# Patient Record
Sex: Male | Born: 1970 | Race: White | Hispanic: No | Marital: Married | Smoking: Never smoker
Health system: Southern US, Community
[De-identification: ages and names within clinical notes are randomized; demographics above are authoritative.]

## PROBLEM LIST (undated history)

## (undated) DIAGNOSIS — E785 Hyperlipidemia, unspecified: Secondary | ICD-10-CM

## (undated) HISTORY — PX: WRIST SURGERY: SHX841

---

## 2004-06-20 ENCOUNTER — Emergency Department (HOSPITAL_COMMUNITY): Admission: EM | Admit: 2004-06-20 | Discharge: 2004-06-21 | Payer: Self-pay | Admitting: *Deleted

## 2005-05-31 ENCOUNTER — Ambulatory Visit (HOSPITAL_COMMUNITY): Admission: RE | Admit: 2005-05-31 | Discharge: 2005-05-31 | Payer: Self-pay | Admitting: Orthopedic Surgery

## 2007-04-20 IMAGING — CR DG SHOULDER 2+V*L*
3 series · 3 of 3 positions shown · non-contrast
Comparison: none

CLINICAL DATA: Shoulder injury playing baseball

LEFT SHOULDER - 3 VIEW

[w shoulder ap internal left *]
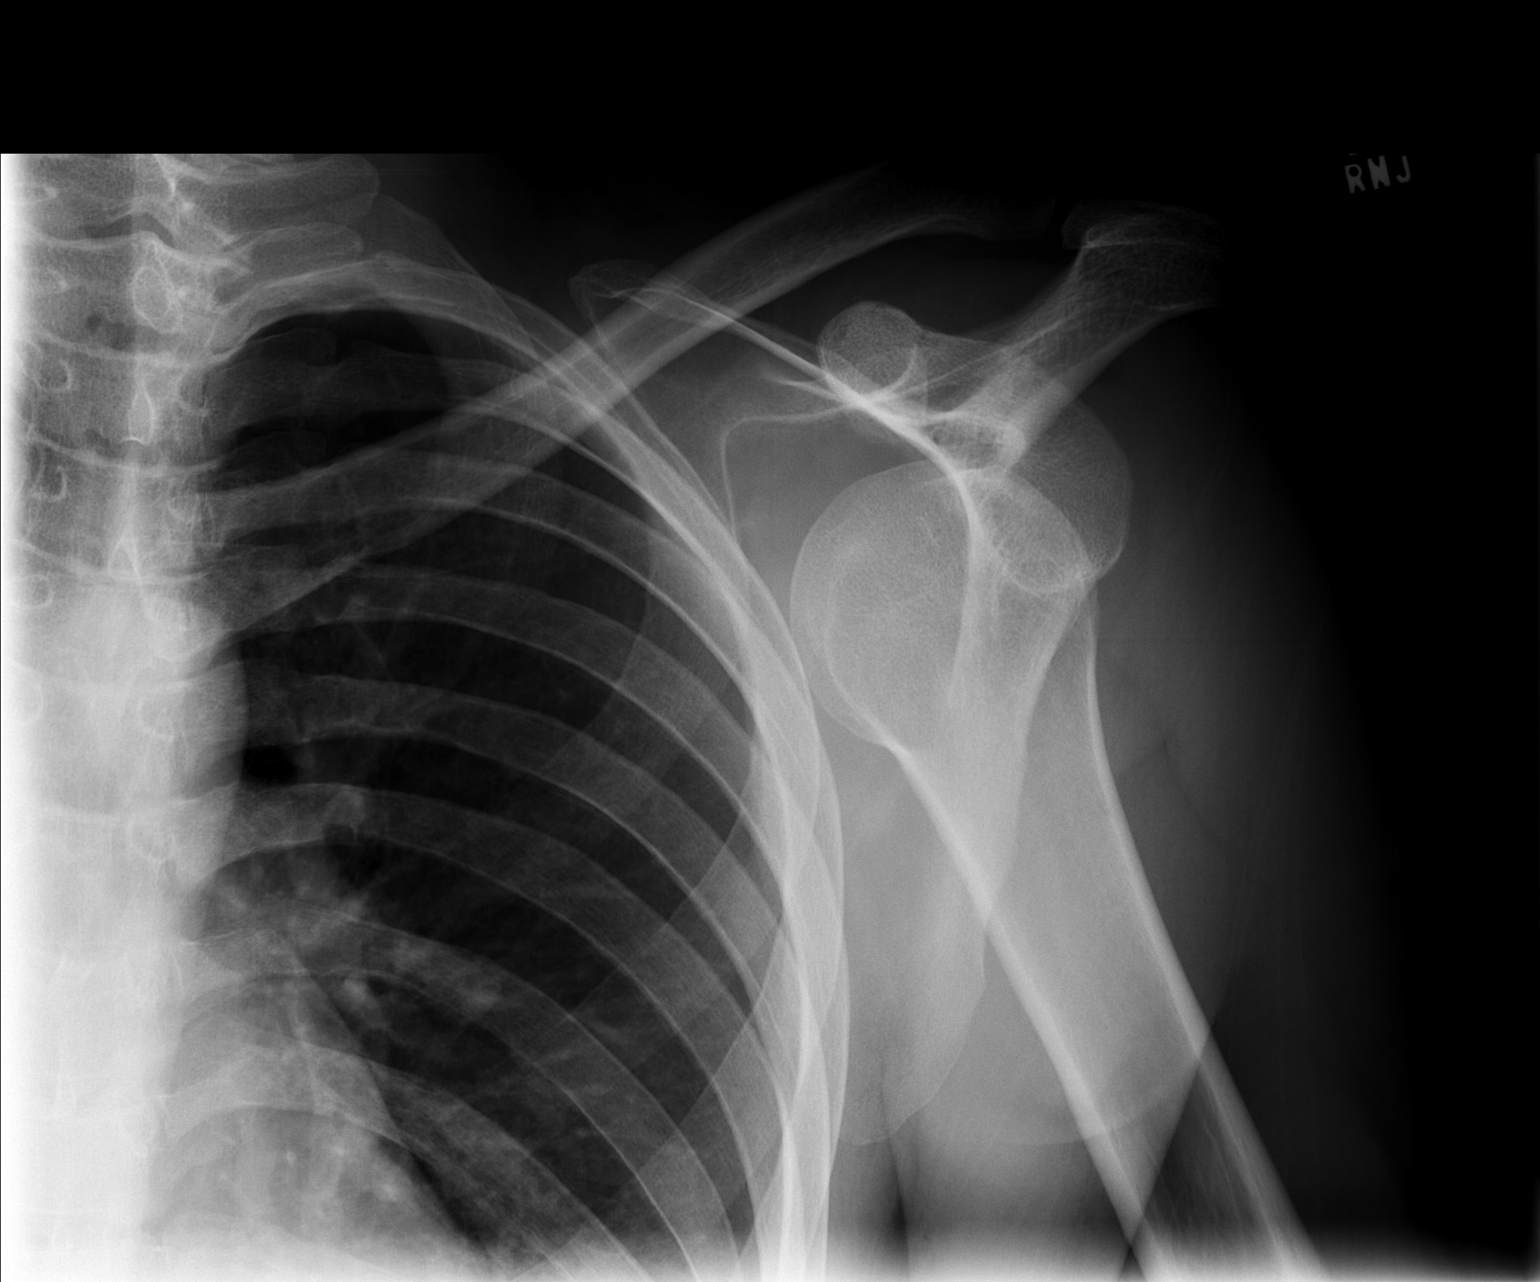

[w shoulder y view left (1 of 2)]
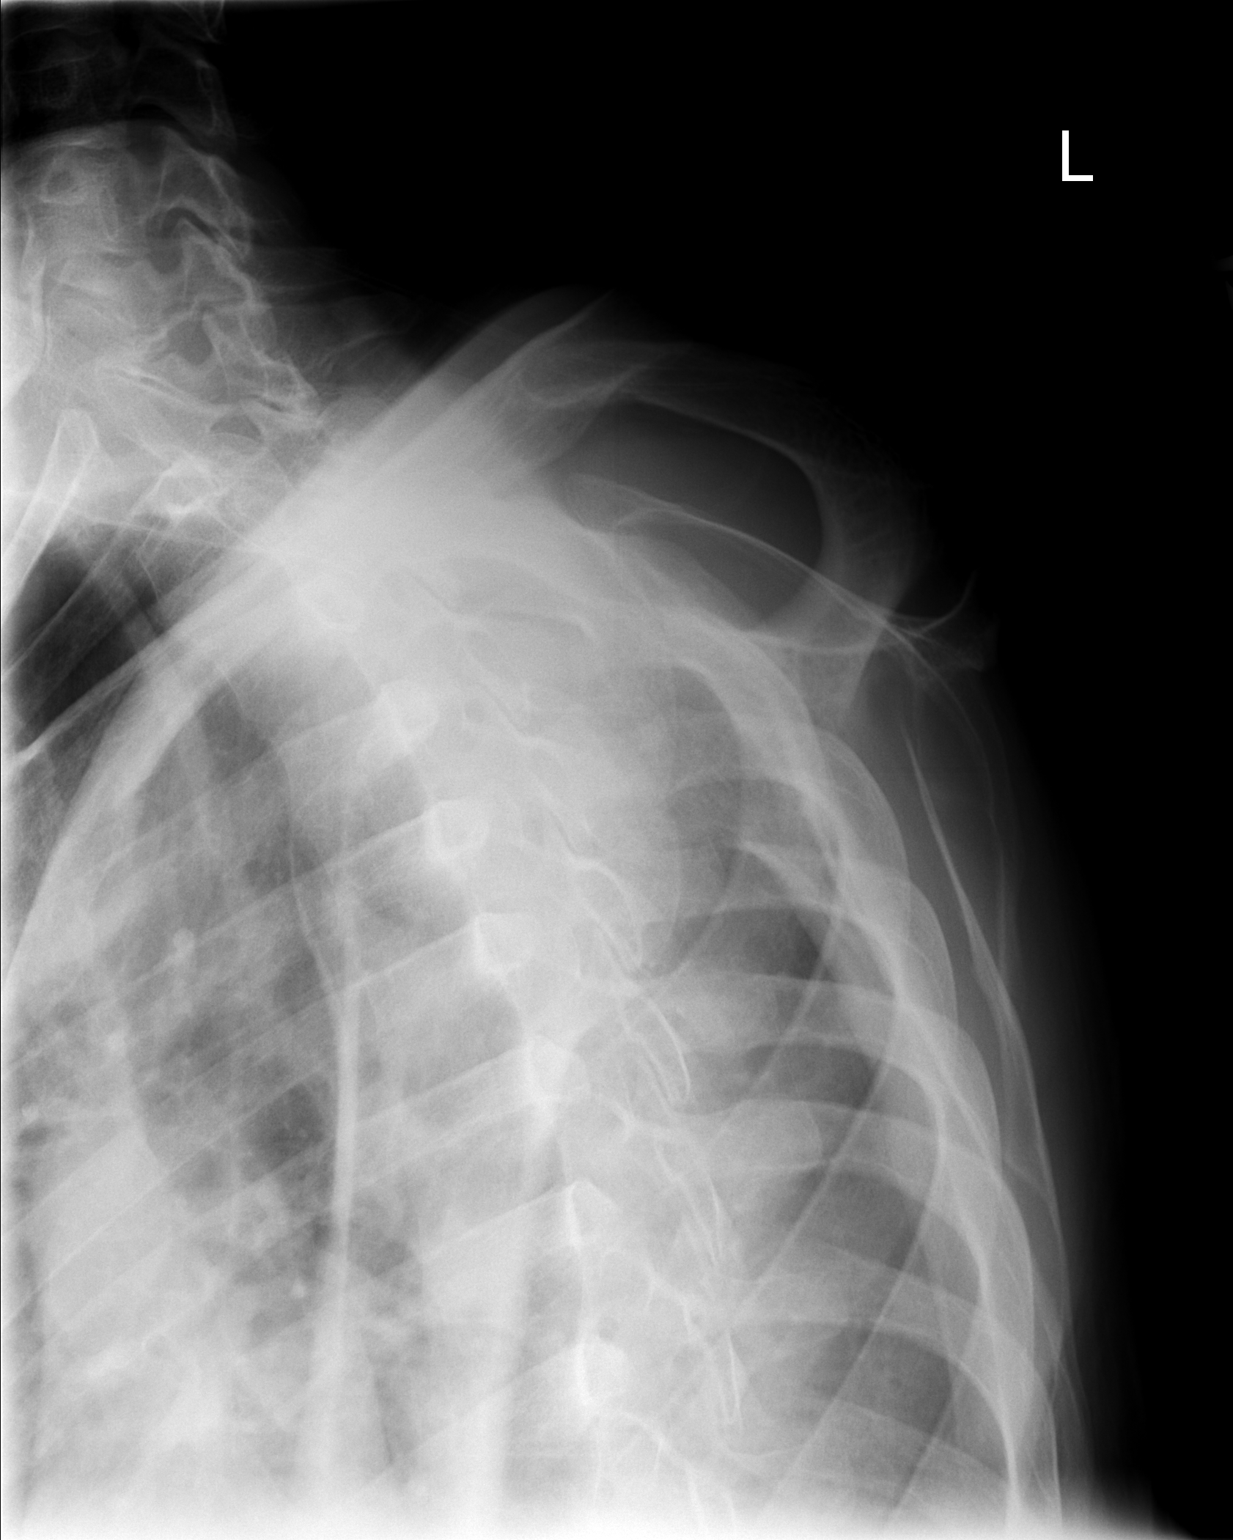

[w shoulder y view left (2 of 2)]
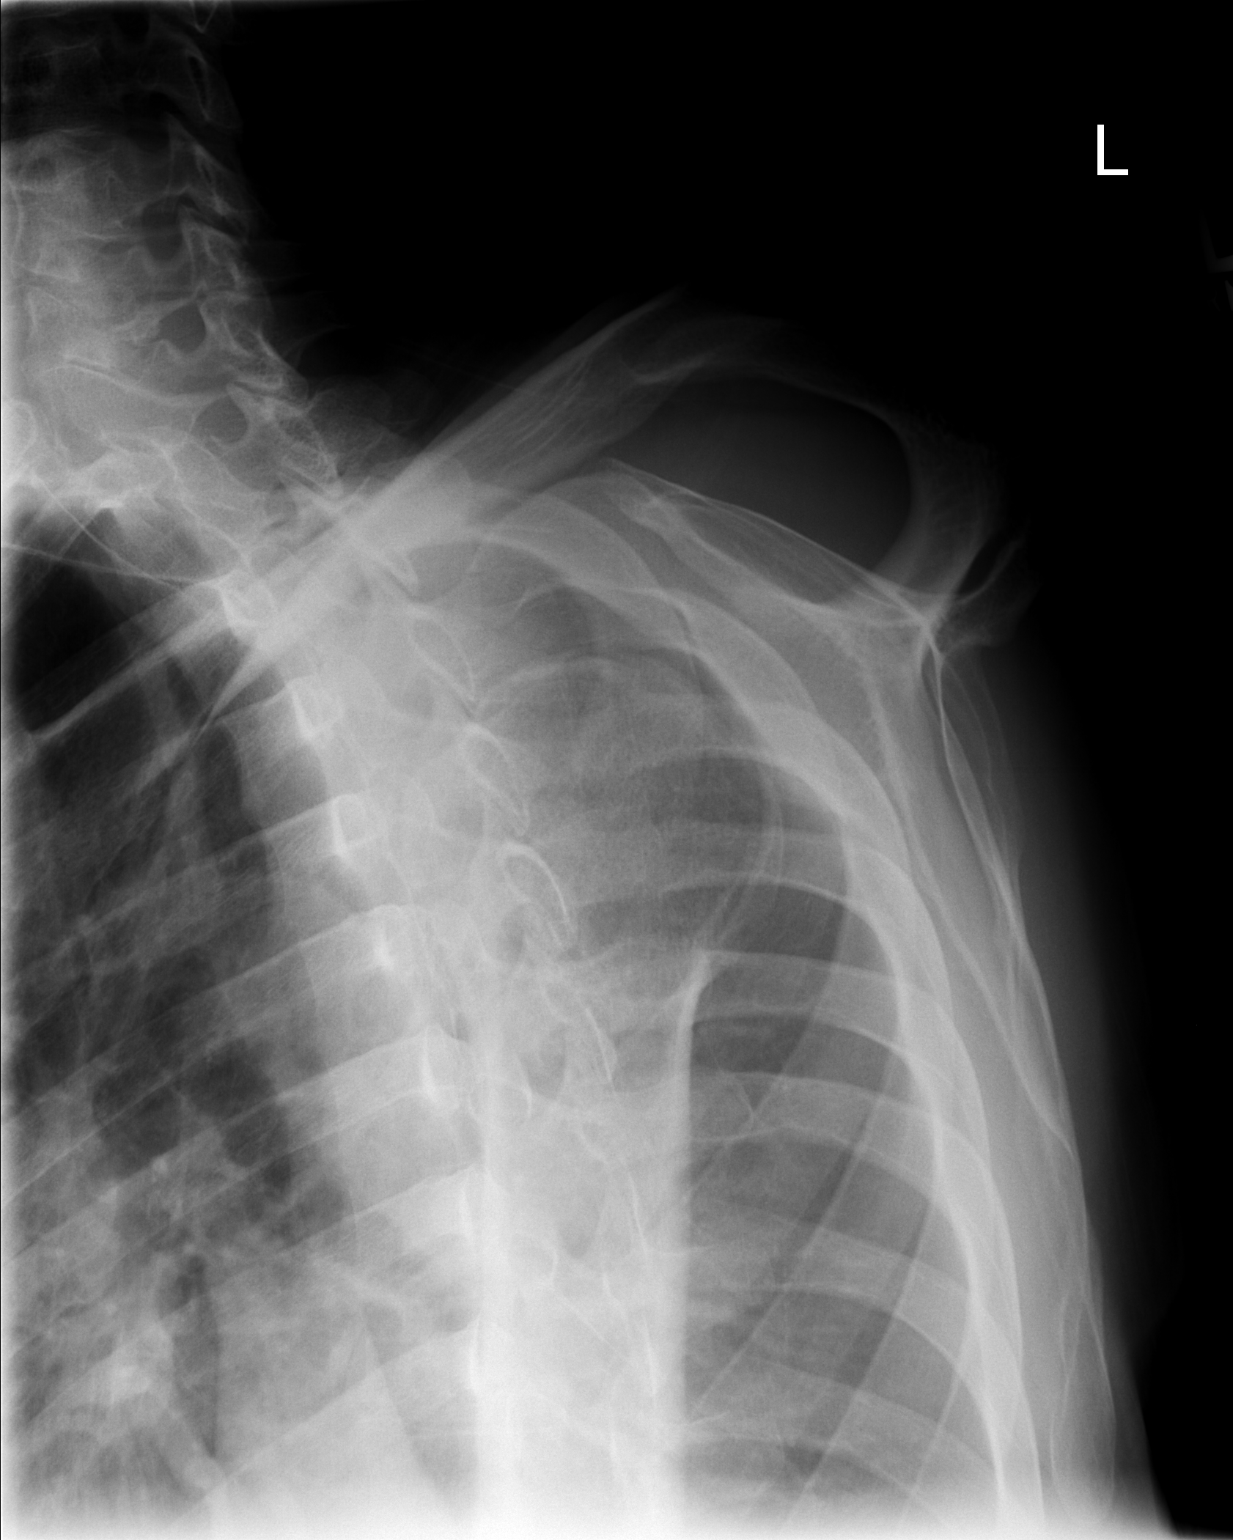

[3 of 3 positions shown; findings below may reference images not displayed]

FINDINGS: There is anterior dislocation of the left humeral head. No definite
fracture seen. Recommend postreduction views.

IMPRESSION

Anterior left shoulder dislocation.

## 2014-07-06 ENCOUNTER — Encounter (HOSPITAL_BASED_OUTPATIENT_CLINIC_OR_DEPARTMENT_OTHER): Payer: Self-pay | Admitting: *Deleted

## 2014-07-06 ENCOUNTER — Emergency Department (HOSPITAL_BASED_OUTPATIENT_CLINIC_OR_DEPARTMENT_OTHER)
Admission: EM | Admit: 2014-07-06 | Discharge: 2014-07-06 | Disposition: A | Discharge status: Home / Self Care | Payer: Managed Care, Other (non HMO) | Attending: Emergency Medicine | Admitting: Emergency Medicine

## 2014-07-06 DIAGNOSIS — S0101XA Laceration without foreign body of scalp, initial encounter: Secondary | ICD-10-CM | POA: Diagnosis not present

## 2014-07-06 DIAGNOSIS — Y288XXA Contact with other sharp object, undetermined intent, initial encounter: Secondary | ICD-10-CM | POA: Diagnosis not present

## 2014-07-06 DIAGNOSIS — Y9289 Other specified places as the place of occurrence of the external cause: Secondary | ICD-10-CM | POA: Insufficient documentation

## 2014-07-06 DIAGNOSIS — Z23 Encounter for immunization: Secondary | ICD-10-CM | POA: Diagnosis not present

## 2014-07-06 DIAGNOSIS — Y998 Other external cause status: Secondary | ICD-10-CM | POA: Diagnosis not present

## 2014-07-06 DIAGNOSIS — Y9389 Activity, other specified: Secondary | ICD-10-CM | POA: Diagnosis not present

## 2014-07-06 DIAGNOSIS — S0191XA Laceration without foreign body of unspecified part of head, initial encounter: Secondary | ICD-10-CM

## 2014-07-06 MED ORDER — LIDOCAINE-EPINEPHRINE (PF) 2 %-1:200000 IJ SOLN
10.0000 mL | Freq: Once | INTRAMUSCULAR | Status: AC
  Administered 2014-07-06: 10 mL via INTRADERMAL
  Filled 2014-07-06: qty 10

## 2014-07-06 MED ORDER — TETANUS-DIPHTH-ACELL PERTUSSIS 5-2.5-18.5 LF-MCG/0.5 IM SUSP
0.5000 mL | Freq: Once | INTRAMUSCULAR | Status: AC
  Administered 2014-07-06: 0.5 mL via INTRAMUSCULAR
  Filled 2014-07-06: qty 0.5

## 2014-07-06 MED ORDER — BACITRACIN 500 UNIT/GM EX OINT
1.0000 "application " | TOPICAL_OINTMENT | Freq: Two times a day (BID) | CUTANEOUS | Status: DC
  Filled 2014-07-06: qty 0.9

## 2014-07-06 NOTE — ED Notes (Signed)
Laceration to the right side of his head. He hit it on the frame of his truck.

## 2014-07-06 NOTE — ED Notes (Signed)
Laceration to head, R upper forehead. Sutures look WNL  Site well approximated, no drainage noted

## 2014-07-06 NOTE — ED Notes (Signed)
Pt verbalizes understanding of d/c instructions, will be back in 1 week for suture removal

## 2014-07-06 NOTE — Discharge Instructions (Signed)
Have the sutures removed by her primary care physician or an urgent care in 5-7 days. Keep the wound clean and dry. Take ibuprofen or Tylenol for pain.

## 2014-07-06 NOTE — ED Provider Notes (Signed)
CSN: [id number]     Arrival date & time 07/06/14  1622 History   First MD Initiated Contact with Patient 07/06/14 1624     Chief Complaint  Patient presents with  . Head Laceration     (Consider location/radiation/quality/duration/timing/severity/associated sxs/prior Treatment) The history is provided by the patient. No language interpreter was used.  Anthony Novak is a 44 year old male who presents with right scalp laceration while getting into his vehicle just prior to arrival. He states he got into the car to quickly and hit his head on the vehicle causing a laceration. He states he was able to stop the bleeding by applying butterfly sutures to the area and covering it with a Band-Aid. He denies any loss of consciousness, blurry vision, diplopia, dizziness, headache, nausea, vomiting. He denies being on any anticoagulation medications. No treatment with medication prior to arrival. He states that his last tetanus shot was more than 5 years ago but not remember when.  History reviewed. No pertinent past medical history. Past Surgical History  Procedure Laterality Date  . Wrist surgery     No family history on file. History  Substance Use Topics  . Smoking status: Never Smoker   . Smokeless tobacco: Not on file  . Alcohol Use: No    Review of Systems  Musculoskeletal: Negative for neck pain and neck stiffness.  Skin: Positive for wound.  Neurological: Negative for dizziness, syncope, weakness and light-headedness.      Allergies  Review of patient's allergies indicates no known allergies.  Home Medications   Prior to Admission medications   Not on File   BP 150/78 mmHg  Pulse 86  Temp(Src) 98.6 F (37 C) (Oral)  Resp 16  SpO2 100% Physical Exam  Constitutional: He is oriented to person, place, and time. He appears well-developed and well-nourished.  HENT:  Head: Normocephalic.  Eyes: Conjunctivae are normal.  Neck: Normal range of motion. Neck supple.   Cardiovascular: Normal rate.   Pulmonary/Chest: Effort normal.  Abdominal: He exhibits no distension.  Musculoskeletal: Normal range of motion.  Neurological: He is alert and oriented to person, place, and time.  Skin: Skin is warm and dry.  2 cm linear laceration to the right parietal scalp area. No active bleeding.    ED Course  Procedures (including critical care time) Labs Review Labs Reviewed - No data to display LACERATION REPAIR Performed by: Catha GosselinPatel-Mills, Hoyte Ziebell Authorized by: Catha GosselinPatel-Mills, Jeanette Moffatt Consent: Verbal consent obtained. Risks and benefits: risks, benefits and alternatives were discussed Consent given by: patient Patient identity confirmed: provided demographic data Prepped and Draped in normal sterile fashion Wound explored Laceration Location: right parietal scalp Laceration Length: 2 cm No Foreign Bodies seen or palpated Anesthesia: local infiltration Local anesthetic: lidocaine 2% with epinephrine Anesthetic total: 2 ml Irrigation method: syringe Amount of cleaning: standard Skin closure: 5.0 prolene Number of sutures: 1 Technique: running suture Patient tolerance: Patient tolerated the procedure well with no immediate complications. The skin and connective tissues could be visualized.  No foreign bodies were visualized. There was no pericranium or bone visualized.   Imaging Review No results found.   EKG Interpretation None      MDM   Final diagnoses:  Laceration of head, initial encounter  Patient presents for right scalp laceration that occurred just prior to arrival. Medications  lidocaine-EPINEPHrine (XYLOCAINE W/EPI) 2 %-1:200000 (PF) injection 10 mL (10 mLs Intradermal Given 07/06/14 1648)  Tdap (BOOSTRIX) injection 0.5 mL (0.5 mLs Intramuscular Given 07/06/14 1644)  He can follow  up with his PCP or urgent care to have the sutures removed in 5-7 days.       Catha Gosselin, PA-C 07/06/14 2234  Gwyneth Sprout, MD 07/08/14 [phone]

## 2022-08-29 ENCOUNTER — Ambulatory Visit: Payer: Self-pay | Admitting: General Surgery

## 2022-10-03 ENCOUNTER — Encounter (HOSPITAL_BASED_OUTPATIENT_CLINIC_OR_DEPARTMENT_OTHER): Payer: Self-pay | Admitting: General Surgery

## 2022-10-10 MED ORDER — CHLORHEXIDINE GLUCONATE CLOTH 2 % EX PADS: 6.0000 | MEDICATED_PAD | Freq: Once | CUTANEOUS | Status: DC

## 2022-10-10 NOTE — Progress Notes (Signed)

## 2022-10-11 ENCOUNTER — Ambulatory Visit (HOSPITAL_BASED_OUTPATIENT_CLINIC_OR_DEPARTMENT_OTHER): Payer: 59 | Admitting: Anesthesiology

## 2022-10-11 ENCOUNTER — Other Ambulatory Visit: Payer: Self-pay

## 2022-10-11 ENCOUNTER — Encounter (HOSPITAL_BASED_OUTPATIENT_CLINIC_OR_DEPARTMENT_OTHER): Payer: Self-pay | Admitting: General Surgery

## 2022-10-11 ENCOUNTER — Ambulatory Visit (HOSPITAL_BASED_OUTPATIENT_CLINIC_OR_DEPARTMENT_OTHER)
Admission: RE | Admit: 2022-10-11 | Discharge: 2022-10-11 | Disposition: A | Discharge status: Home / Self Care | Payer: 59 | Attending: General Surgery | Admitting: General Surgery

## 2022-10-11 ENCOUNTER — Encounter (HOSPITAL_BASED_OUTPATIENT_CLINIC_OR_DEPARTMENT_OTHER)
Admission: RE | Disposition: A | Discharge status: Home / Self Care | Payer: Self-pay | Source: Home / Self Care | Attending: General Surgery

## 2022-10-11 DIAGNOSIS — K4090 Unilateral inguinal hernia, without obstruction or gangrene, not specified as recurrent: Secondary | ICD-10-CM | POA: Insufficient documentation

## 2022-10-11 DIAGNOSIS — Z01818 Encounter for other preprocedural examination: Secondary | ICD-10-CM

## 2022-10-11 HISTORY — PX: INGUINAL HERNIA REPAIR: SHX194

## 2022-10-11 HISTORY — DX: Hyperlipidemia, unspecified: E78.5

## 2022-10-11 SURGERY — REPAIR, HERNIA, INGUINAL, ADULT
Anesthesia: Regional | Site: Groin | Laterality: Left

## 2022-10-11 MED ORDER — DEXAMETHASONE SODIUM PHOSPHATE 10 MG/ML IJ SOLN
INTRAMUSCULAR | Status: AC
  Filled 2022-10-11: qty 3

## 2022-10-11 MED ORDER — PROPOFOL 10 MG/ML IV BOLUS
INTRAVENOUS | Status: DC | PRN
  Administered 2022-10-11: 180 mg via INTRAVENOUS

## 2022-10-11 MED ORDER — FENTANYL CITRATE (PF) 100 MCG/2ML IJ SOLN: 25.0000 ug | INTRAMUSCULAR | Status: DC | PRN

## 2022-10-11 MED ORDER — CEFAZOLIN SODIUM-DEXTROSE 2-4 GM/100ML-% IV SOLN
2.0000 g | INTRAVENOUS | Status: AC
  Administered 2022-10-11: 2 g via INTRAVENOUS

## 2022-10-11 MED ORDER — DEXAMETHASONE SODIUM PHOSPHATE 10 MG/ML IJ SOLN
INTRAMUSCULAR | Status: DC | PRN
Start: 2022-10-11 — End: 2022-10-11
  Administered 2022-10-11: 10 mg

## 2022-10-11 MED ORDER — DEXAMETHASONE SODIUM PHOSPHATE 4 MG/ML IJ SOLN
INTRAMUSCULAR | Status: DC | PRN
  Administered 2022-10-11: 8 mg via INTRAVENOUS

## 2022-10-11 MED ORDER — OXYCODONE HCL 5 MG PO TABS: 5.0000 mg | ORAL_TABLET | Freq: Four times a day (QID) | ORAL | 0 refills | Status: AC | PRN

## 2022-10-11 MED ORDER — ACETAMINOPHEN 500 MG PO TABS
1000.0000 mg | ORAL_TABLET | ORAL | Status: AC
  Administered 2022-10-11: 1000 mg via ORAL

## 2022-10-11 MED ORDER — CEFAZOLIN SODIUM-DEXTROSE 2-4 GM/100ML-% IV SOLN
INTRAVENOUS | Status: AC
  Filled 2022-10-11: qty 100

## 2022-10-11 MED ORDER — ROCURONIUM BROMIDE 100 MG/10ML IV SOLN
INTRAVENOUS | Status: DC | PRN
  Administered 2022-10-11: 50 mg via INTRAVENOUS

## 2022-10-11 MED ORDER — OXYCODONE HCL 5 MG/5ML PO SOLN: 5.0000 mg | Freq: Once | ORAL | Status: DC | PRN

## 2022-10-11 MED ORDER — OXYCODONE HCL 5 MG PO TABS: 5.0000 mg | ORAL_TABLET | Freq: Once | ORAL | Status: DC | PRN

## 2022-10-11 MED ORDER — BUPIVACAINE-EPINEPHRINE 0.25% -1:200000 IJ SOLN
INTRAMUSCULAR | Status: DC | PRN
  Administered 2022-10-11: 20 mL

## 2022-10-11 MED ORDER — BUPIVACAINE-EPINEPHRINE (PF) 0.25% -1:200000 IJ SOLN
INTRAMUSCULAR | Status: AC
  Filled 2022-10-11: qty 90

## 2022-10-11 MED ORDER — GABAPENTIN 300 MG PO CAPS
300.0000 mg | ORAL_CAPSULE | ORAL | Status: AC
  Administered 2022-10-11: 300 mg via ORAL

## 2022-10-11 MED ORDER — LACTATED RINGERS IV SOLN: INTRAVENOUS | Status: DC

## 2022-10-11 MED ORDER — FENTANYL CITRATE (PF) 100 MCG/2ML IJ SOLN
INTRAMUSCULAR | Status: AC
  Filled 2022-10-11: qty 2

## 2022-10-11 MED ORDER — ONDANSETRON HCL 4 MG/2ML IJ SOLN
INTRAMUSCULAR | Status: AC
  Filled 2022-10-11: qty 6

## 2022-10-11 MED ORDER — CELECOXIB 200 MG PO CAPS
200.0000 mg | ORAL_CAPSULE | ORAL | Status: AC
  Administered 2022-10-11: 200 mg via ORAL

## 2022-10-11 MED ORDER — ACETAMINOPHEN 500 MG PO TABS
ORAL_TABLET | ORAL | Status: AC
  Filled 2022-10-11: qty 2

## 2022-10-11 MED ORDER — LIDOCAINE 2% (20 MG/ML) 5 ML SYRINGE
INTRAMUSCULAR | Status: AC
  Filled 2022-10-11: qty 15

## 2022-10-11 MED ORDER — ONDANSETRON HCL 4 MG/2ML IJ SOLN
INTRAMUSCULAR | Status: DC | PRN
  Administered 2022-10-11: 4 mg via INTRAVENOUS

## 2022-10-11 MED ORDER — MIDAZOLAM HCL 2 MG/2ML IJ SOLN
INTRAMUSCULAR | Status: AC
  Filled 2022-10-11: qty 2

## 2022-10-11 MED ORDER — DROPERIDOL 2.5 MG/ML IJ SOLN: 0.6250 mg | Freq: Once | INTRAMUSCULAR | Status: DC | PRN

## 2022-10-11 MED ORDER — GABAPENTIN 300 MG PO CAPS
ORAL_CAPSULE | ORAL | Status: AC
  Filled 2022-10-11: qty 1

## 2022-10-11 MED ORDER — SUGAMMADEX SODIUM 200 MG/2ML IV SOLN
INTRAVENOUS | Status: DC | PRN
  Administered 2022-10-11: 200 mg via INTRAVENOUS

## 2022-10-11 MED ORDER — ROPIVACAINE HCL 5 MG/ML IJ SOLN
INTRAMUSCULAR | Status: DC | PRN
Start: 2022-10-11 — End: 2022-10-11
  Administered 2022-10-11: 30 mL

## 2022-10-11 MED ORDER — LIDOCAINE HCL (CARDIAC) PF 100 MG/5ML IV SOSY
PREFILLED_SYRINGE | INTRAVENOUS | Status: DC | PRN
  Administered 2022-10-11: 50 mg via INTRAVENOUS

## 2022-10-11 MED ORDER — CELECOXIB 200 MG PO CAPS
ORAL_CAPSULE | ORAL | Status: AC
  Filled 2022-10-11: qty 1

## 2022-10-11 MED ORDER — FENTANYL CITRATE (PF) 100 MCG/2ML IJ SOLN
100.0000 ug | Freq: Once | INTRAMUSCULAR | Status: AC
  Administered 2022-10-11: 100 ug via INTRAVENOUS

## 2022-10-11 MED ORDER — MIDAZOLAM HCL 2 MG/2ML IJ SOLN
2.0000 mg | Freq: Once | INTRAMUSCULAR | Status: AC
  Administered 2022-10-11: 2 mg via INTRAVENOUS

## 2022-10-11 SURGICAL SUPPLY — 46 items
ADH SKN CLS APL DERMABOND .7 (GAUZE/BANDAGES/DRESSINGS) ×1
APL PRP STRL LF DISP 70% ISPRP (MISCELLANEOUS) ×1
APL SKNCLS STERI-STRIP NONHPOA (GAUZE/BANDAGES/DRESSINGS)
BENZOIN TINCTURE PRP APPL 2/3 (GAUZE/BANDAGES/DRESSINGS) IMPLANT
BLADE CLIPPER SURG (BLADE) IMPLANT
BLADE HEX COATED 2.75 (ELECTRODE) ×1 IMPLANT
BLADE SURG 15 STRL LF DISP TIS (BLADE) ×1 IMPLANT
BLADE SURG 15 STRL SS (BLADE) ×1
CHLORAPREP W/TINT 26 (MISCELLANEOUS) ×1 IMPLANT
COVER BACK TABLE 60X90IN (DRAPES) ×1 IMPLANT
COVER MAYO STAND STRL (DRAPES) ×1 IMPLANT
DERMABOND ADVANCED .7 DNX12 (GAUZE/BANDAGES/DRESSINGS) ×1 IMPLANT
DRAIN PENROSE .5X12 LATEX STL (DRAIN) ×1 IMPLANT
DRAPE LAPAROTOMY TRNSV 102X78 (DRAPES) ×1 IMPLANT
DRAPE UTILITY XL STRL (DRAPES) ×1 IMPLANT
ELECT REM PT RETURN 9FT ADLT (ELECTROSURGICAL) ×1
ELECTRODE REM PT RTRN 9FT ADLT (ELECTROSURGICAL) ×1 IMPLANT
GLOVE BIO SURGEON STRL SZ 6.5 (GLOVE) IMPLANT
GLOVE BIO SURGEON STRL SZ7.5 (GLOVE) ×1 IMPLANT
GLOVE BIOGEL PI IND STRL 7.0 (GLOVE) IMPLANT
GOWN STRL REUS W/ TWL LRG LVL3 (GOWN DISPOSABLE) ×2 IMPLANT
GOWN STRL REUS W/TWL LRG LVL3 (GOWN DISPOSABLE) ×2
MESH ULTRAPRO 3X6 7.6X15CM (Mesh General) IMPLANT
NDL HYPO 25X1 1.5 SAFETY (NEEDLE) ×1 IMPLANT
NEEDLE HYPO 25X1 1.5 SAFETY (NEEDLE) ×1
NS IRRIG 1000ML POUR BTL (IV SOLUTION) ×1 IMPLANT
PACK BASIN DAY SURGERY FS (CUSTOM PROCEDURE TRAY) ×1 IMPLANT
PENCIL SMOKE EVACUATOR (MISCELLANEOUS) ×1 IMPLANT
SLEEVE SCD COMPRESS KNEE MED (STOCKING) ×1 IMPLANT
SPONGE T-LAP 18X18 ~~LOC~~+RFID (SPONGE) ×1 IMPLANT
STRIP CLOSURE SKIN 1/2X4 (GAUZE/BANDAGES/DRESSINGS) IMPLANT
SUT MON AB 4-0 PC3 18 (SUTURE) ×1 IMPLANT
SUT PROLENE 2 0 SH DA (SUTURE) ×2 IMPLANT
SUT SILK 2 0 SH (SUTURE) IMPLANT
SUT SILK 3 0 SH 30 (SUTURE) IMPLANT
SUT SILK 3 0 TIES 17X18 (SUTURE) ×1
SUT SILK 3-0 18XBRD TIE BLK (SUTURE) ×1 IMPLANT
SUT VIC AB 0 CT1 27 (SUTURE)
SUT VIC AB 0 CT1 27XBRD ANBCTR (SUTURE) IMPLANT
SUT VIC AB 2-0 SH 27 (SUTURE) ×1
SUT VIC AB 2-0 SH 27XBRD (SUTURE) ×1 IMPLANT
SUT VIC AB 3-0 SH 27 (SUTURE) ×1
SUT VIC AB 3-0 SH 27X BRD (SUTURE) ×1 IMPLANT
SUT VICRYL 0 SH 27 (SUTURE) IMPLANT
SYR CONTROL 10ML LL (SYRINGE) ×1 IMPLANT
TOWEL GREEN STERILE FF (TOWEL DISPOSABLE) ×2 IMPLANT

## 2022-10-11 NOTE — Op Note (Signed)
10/11/2022  9:18 AM  PATIENT:  Anthony Novak  52 y.o. male  PRE-OPERATIVE DIAGNOSIS:  LEFT INGUINAL HERNIA  POST-OPERATIVE DIAGNOSIS:  LEFT INGUINAL HERNIA  PROCEDURE:  Procedure(s) with comments: OPEN LEFT INGUINAL HERNIA REPAIR WITH MESH   SURGEON:  Surgeons and Role:    * Griselda Miner, MD - Primary  PHYSICIAN ASSISTANT:   ASSISTANTS: none   ANESTHESIA:   local and general  EBL:  minimal   BLOOD ADMINISTERED:none  DRAINS: none   LOCAL MEDICATIONS USED:  MARCAINE     SPECIMEN:  Source of Specimen:  none  DISPOSITION OF SPECIMEN:  N/A  COUNTS:  YES  TOURNIQUET:  * No tourniquets in log *  DICTATION: .Dragon Dictation  After informed consent was obtained the patient was brought to the operating room and placed in the supine position on the operating table.  After adequate induction of general anesthesia the patient's abdomen and left groin were prepped with ChloraPrep, allowed to dry, and draped in usual sterile manner.  An appropriate timeout was performed.  The left groin was then infiltrated with quarter percent Marcaine.  A small incision was made with a 15 blade knife from the edge of the pubic tubercle on the left towards the anterior superior iliac spine.  The incision was carried through the skin and subcutaneous tissue sharply with the electrocautery until the fascia of the external oblique was encountered.  A small bridging vein was clamped with hemostats, divided, and ligated with 3-0 silk ties.  The external oblique fascia was then opened along its fibers towards the apex of the external ring with a 15 blade knife and Metzenbaum scissors.  A [place] retractor was deployed.  Blunt dissection was carried out of the cord structures until they could be surrounded between 2 fingers.  Half inch Penrose drain was placed around the cord structures for retraction purposes.  The cord was then gently skeletonized by blunt hemostat dissection.  I was able to identify a  lipoma of the cord as well as a small hernia sac associated with the cord.  The lipoma was separated bluntly and then ligated at its base with a 3-0 silk tie.  The sac was small and broad-based so it was reduced back to its normal position.  The floor of the canal was then repaired with interrupted 0 Vicryl stitches.  The ilioinguinal nerve was looked for but never seen.  Next a 3 x 6 piece of ultra Pro mesh was chosen and cut to the appropriate size.  The mesh was sewed inferiorly to the shelving edge of the inguinal ligament with a running 2-0 Prolene stitch.  Tails were cut in the mesh laterally and the tails were wrapped around the cord structures.  Medially and superiorly the mesh was sewed to the muscular aponeurotic strength layer of the transversalis with interrupted 2-0 Prolene vertical mattress stitches.  Lateral to the cord the tails of the mesh were anchored to the shelving edge of the inguinal ligament with interrupted 2-0 Prolene stitches.  Once this was accomplished the mesh was in good position and the hernia seem well repaired.  The wound was irrigated with copious amounts of saline.  The external oblique fascia was then reapproximated with a running 2-0 Vicryl stitch.  The wound was infiltrated with more quarter percent Marcaine.  The subcutaneous fascia was closed with a running 3-0 Vicryl stitch.  The skin was then closed with a running 4-0 Monocryl subcuticular stitch.  Dermabond dressings were applied.  The patient tolerated the procedure well.  At the end of the case all needle sponge and instrument counts were correct.  The patient was then awakened and taken to recovery in stable condition.  PLAN OF CARE: Discharge to home after PACU  PATIENT DISPOSITION:  PACU - hemodynamically stable.   Delay start of Pharmacological VTE agent (>24hrs) due to surgical blood loss or risk of bleeding: not applicable

## 2022-10-11 NOTE — Anesthesia Postprocedure Evaluation (Signed)
Anesthesia Post Note  Patient: Anthony Novak  Procedure(s) Performed: OPEN LEFT INGUINAL HERNIA REPAIR WITH MESH (Left: Groin)     Patient location during evaluation: PACU Anesthesia Type: Regional and General Level of consciousness: awake and alert Pain management: pain level controlled Vital Signs Assessment: post-procedure vital signs reviewed and stable Respiratory status: spontaneous breathing, nonlabored ventilation, respiratory function stable and patient connected to nasal cannula oxygen Cardiovascular status: blood pressure returned to baseline and stable Postop Assessment: no apparent nausea or vomiting Anesthetic complications: no   No notable events documented.  Last Vitals:  Vitals:   10/11/22 1000 10/11/22 1015  BP: 129/84 129/81  Pulse: 72 62  Resp: 13 16  Temp:  36.5 C  SpO2: 97% 95%    Last Pain:  Vitals:   10/11/22 1015  TempSrc:   PainSc: 0-No pain                 Earl Lites P Raeqwon Lux

## 2022-10-11 NOTE — Discharge Instructions (Signed)
  Post Anesthesia Home Care Instructions  Activity: Get plenty of rest for the remainder of the day. A responsible individual must stay with you for 24 hours following the procedure.  For the next 24 hours, DO NOT: -Drive a car -Advertising copywriter -Drink alcoholic beverages -Take any medication unless instructed by your physician -Make any legal decisions or sign important papers.  Meals: Start with liquid foods such as gelatin or soup. Progress to regular foods as tolerated. Avoid greasy, spicy, heavy foods. If nausea and/or vomiting occur, drink only clear liquids until the nausea and/or vomiting subsides. Call your physician if vomiting continues.  Special Instructions/Symptoms: Your throat may feel dry or sore from the anesthesia or the breathing tube placed in your throat during surgery. If this causes discomfort, gargle with warm salt water. The discomfort should disappear within 24 hours.  If you had a scopolamine patch placed behind your ear for the management of post- operative nausea and/or vomiting:  1. The medication in the patch is effective for 72 hours, after which it should be removed.  Wrap patch in a tissue and discard in the trash. Wash hands thoroughly with soap and water. 2. You may remove the patch earlier than 72 hours if you experience unpleasant side effects which may include dry mouth, dizziness or visual disturbances. 3. Avoid touching the patch. Wash your hands with soap and water after contact with the patch.      Next dose of tylenol may be taken at 2p  Next dose of ibuprofen may be taken at 2p

## 2022-10-11 NOTE — Anesthesia Procedure Notes (Signed)
Anesthesia Regional Block: TAP block   Pre-Anesthetic Checklist: , timeout performed,  Correct Patient, Correct Site, Correct Laterality,  Correct Procedure, Correct Position, site marked,  Risks and benefits discussed,  Surgical consent,  Pre-op evaluation,  At surgeon's request and post-op pain management  Laterality: Left  Prep: Dura Prep       Needles:  Injection technique: Single-shot  Needle Type: Echogenic Stimulator Needle     Needle Length: 10cm  Needle Gauge: 20     Additional Needles:   Procedures:,,,, ultrasound used (permanent image in chart),,    Narrative:  Start time: 10/11/2022 7:45 AM End time: 10/11/2022 7:50 AM Injection made incrementally with aspirations every 5 mL.  Performed by: Personally  Anesthesiologist: Atilano Median, DO  Additional Notes: Patient identified. Risks/Benefits/Options discussed with patient including but not limited to bleeding, infection, nerve damage, failed block, incomplete pain control. Patient expressed understanding and wished to proceed. All questions were answered. Sterile technique was used throughout the entire procedure. Please see nursing notes for vital signs. Aspirated in 5cc intervals with injection for negative confirmation. Patient was given instructions on fall risk and not to get out of bed. All questions and concerns addressed with instructions to call with any issues or inadequate analgesia.

## 2022-10-11 NOTE — Interval H&P Note (Signed)
History and Physical Interval Note:  10/11/2022 7:38 AM  Anthony Novak  has presented today for surgery, with the diagnosis of LEFT INGUINAL HERNIA.  The various methods of treatment have been discussed with the patient and family. After consideration of risks, benefits and other options for treatment, the patient has consented to  Procedure(s) with comments: OPEN LEFT INGUINAL HERNIA REPAIR WITH MESH (Left) - 90 MINS NEEDED IN ROOM 8 as a surgical intervention.  The patient's history has been reviewed, patient examined, no change in status, stable for surgery.  I have reviewed the patient's chart and labs.  Questions were answered to the patient's satisfaction.     Chevis Pretty III

## 2022-10-11 NOTE — Transfer of Care (Signed)
Immediate Anesthesia Transfer of Care Note  Patient: Anthony Novak  Procedure(s) Performed: OPEN LEFT INGUINAL HERNIA REPAIR WITH MESH (Left: Groin)  Patient Location: PACU  Anesthesia Type:General  Level of Consciousness: awake and patient cooperative  Airway & Oxygen Therapy: Patient Spontanous Breathing and Patient connected to nasal cannula oxygen  Post-op Assessment: Report given to RN and Post -op Vital signs reviewed and stable  Post vital signs: Reviewed and stable  Last Vitals:  Vitals Value Taken Time  BP    Temp    Pulse 89 10/11/22 0930  Resp    SpO2 97 % 10/11/22 0930  Vitals shown include unfiled device data.  Last Pain:  Vitals:   10/11/22 0659  TempSrc: Temporal  PainSc: 0-No pain      Patients Stated Pain Goal: 3 (10/11/22 0659)  Complications: No notable events documented.

## 2022-10-11 NOTE — Progress Notes (Signed)
Assisted Dr. Jana Half with left, transabdominal plane, ultrasound guided block. Side rails up, monitors on throughout procedure. See vital signs in flow sheet. Tolerated Procedure well.

## 2022-10-11 NOTE — Anesthesia Procedure Notes (Signed)
Procedure Name: Intubation Date/Time: 10/11/2022 8:09 AM  Performed by: Earmon Phoenix, CRNAPre-anesthesia Checklist: Patient identified, Emergency Drugs available, Suction available, Patient being monitored and Timeout performed Patient Re-evaluated:Patient Re-evaluated prior to induction Oxygen Delivery Method: Circle system utilized Preoxygenation: Pre-oxygenation with 100% oxygen Induction Type: IV induction Ventilation: Mask ventilation without difficulty Laryngoscope Size: Mac and 4 Grade View: Grade I Tube type: Oral Tube size: 7.5 mm Number of attempts: 1 Airway Equipment and Method: Stylet Placement Confirmation: ETT inserted through vocal cords under direct vision, positive ETCO2 and breath sounds checked- equal and bilateral Secured at: 23 cm Tube secured with: Tape Dental Injury: Teeth and Oropharynx as per pre-operative assessment

## 2022-10-11 NOTE — H&P (Signed)
REFERRING PHYSICIAN: Barbaraann Boys, MD PROVIDER: Lindell Noe, MD MRN: [id number] DOB: 1970-04-19 Subjective   Chief Complaint: New Consultation  History of Present Illness: Anthony Novak is a 52 y.o. male who is seen today as an office consultation for evaluation of New Consultation  We are asked to see the patient in consultation by Dr. Willette Cluster to evaluate him for a left inguinal hernia. The patient is a 52 year old white male who has noticed a bulge in the left groin over the last several months. He does not remember a particular event that started it. He denies any significant pain. He denies any nausea or vomiting. He is otherwise in good health and does not smoke.  Review of Systems: A complete review of systems was obtained from the patient. I have reviewed this information and discussed as appropriate with the patient. See HPI as well for other ROS.  ROS   Medical History: History reviewed. No pertinent past medical history.  Patient Active Problem List  Diagnosis  Left inguinal hernia   Past Surgical History:  Procedure Laterality Date  Wrist surgery 1996    No Known Allergies  Current Outpatient Medications on File Prior to Visit  Medication Sig Dispense Refill  atorvastatin (LIPITOR) 10 MG tablet Take 10 mg by mouth once daily   No current facility-administered medications on file prior to visit.   Family History  Problem Relation Age of Onset  High blood pressure (Hypertension) Mother  Diabetes Mother  Coronary Artery Disease (Blocked arteries around heart) Father  Diabetes Brother    Social History   Tobacco Use  Smoking Status Never  Smokeless Tobacco Never    Social History   Socioeconomic History  Marital status: Married  Tobacco Use  Smoking status: Never  Smokeless tobacco: Never  Substance and Sexual Activity  Alcohol use: Yes  Drug use: Never   Objective:   Vitals:  BP: 134/78  Pulse: 106  Temp: 36.7 C (98  F)  SpO2: 98%  Weight: 81.3 kg (179 lb 3.2 oz)  Height: 177.8 cm (5\' 10" )  PainSc: 0-No pain   Body mass index is 25.71 kg/m.  Physical Exam Constitutional:  General: He is not in acute distress. Appearance: Normal appearance.  HENT:  Head: Normocephalic and atraumatic.  Right Ear: External ear normal.  Left Ear: External ear normal.  Nose: Nose normal.  Mouth/Throat:  Mouth: Mucous membranes are moist.  Pharynx: Oropharynx is clear.  Eyes:  General: No scleral icterus. Extraocular Movements: Extraocular movements intact.  Conjunctiva/sclera: Conjunctivae normal.  Pupils: Pupils are equal, round, and reactive to light.  Cardiovascular:  Rate and Rhythm: Normal rate and regular rhythm.  Pulses: Normal pulses.  Heart sounds: Normal heart sounds.  Pulmonary:  Effort: Pulmonary effort is normal. No respiratory distress.  Breath sounds: Normal breath sounds.  Abdominal:  General: Abdomen is flat. Bowel sounds are normal. There is no distension.  Palpations: Abdomen is soft.  Tenderness: There is no abdominal tenderness.  Genitourinary: Comments: There is a small to medium size bulge in the left groin that reduces easily. There is no palpable bulge or impulse with straining in the right groin Musculoskeletal:  General: No swelling or deformity. Normal range of motion.  Cervical back: Normal range of motion and neck supple. No tenderness.  Skin: General: Skin is warm and dry.  Coloration: Skin is not jaundiced.  Neurological:  General: No focal deficit present.  Mental Status: He is alert and oriented to person, place, and time.  Psychiatric:  Mood and Affect: Mood normal.  Behavior: Behavior normal.     Labs, Imaging and Diagnostic Testing:  Assessment and Plan:   Diagnoses and all orders for this visit:  Left inguinal hernia   The patient appears to have a left inguinal hernia. Because of the risk of incarceration and strangulation I feel he would benefit  from having this fixed. He would also like to have this done. I have discussed with him in detail the risk and benefits of the operation as well as some of the technical aspects including the use of mesh and the risk of chronic pain and he understands and wishes to proceed. We will proceed with surgical scheduling

## 2022-10-11 NOTE — Anesthesia Preprocedure Evaluation (Addendum)
Anesthesia Evaluation  Patient identified by MRN, date of birth, ID band Patient awake    Reviewed: Allergy & Precautions, NPO status , Patient's Chart, lab work & pertinent test results  Airway Mallampati: I  TM Distance: >3 FB Neck ROM: Full    Dental no notable dental hx.    Pulmonary neg pulmonary ROS   Pulmonary exam normal        Cardiovascular negative cardio ROS  Rhythm:Regular Rate:Normal     Neuro/Psych negative neurological ROS  negative psych ROS   GI/Hepatic Neg liver ROS,,,Left inguinal hernia    Endo/Other  negative endocrine ROS    Renal/GU negative Renal ROS  negative genitourinary   Musculoskeletal negative musculoskeletal ROS (+)    Abdominal Normal abdominal exam  (+)   Peds  Hematology negative hematology ROS (+)   Anesthesia Other Findings   Reproductive/Obstetrics                             Anesthesia Physical Anesthesia Plan  ASA: 2  Anesthesia Plan: General and Regional   Post-op Pain Management: Regional block*, Celebrex PO (pre-op)*, Tylenol PO (pre-op)* and Gabapentin PO (pre-op)*   Induction: Intravenous  PONV Risk Score and Plan: 2 and Ondansetron, Dexamethasone, Midazolam and Treatment may vary due to age or medical condition  Airway Management Planned: Mask and Oral ETT  Additional Equipment: None  Intra-op Plan:   Post-operative Plan: Extubation in OR  Informed Consent: I have reviewed the patients History and Physical, chart, labs and discussed the procedure including the risks, benefits and alternatives for the proposed anesthesia with the patient or authorized representative who has indicated his/her understanding and acceptance.     Dental advisory given  Plan Discussed with: CRNA  Anesthesia Plan Comments:        Anesthesia Quick Evaluation

## 2022-10-12 ENCOUNTER — Encounter (HOSPITAL_BASED_OUTPATIENT_CLINIC_OR_DEPARTMENT_OTHER): Payer: Self-pay | Admitting: General Surgery
